# Patient Record
Sex: Male | Born: 1990 | Race: White | Hispanic: No | Marital: Single | Smoking: Never smoker
Health system: Southern US, Community
[De-identification: ages and names within clinical notes are randomized; demographics above are authoritative.]

## PROBLEM LIST (undated history)

## (undated) DIAGNOSIS — G919 Hydrocephalus, unspecified: Secondary | ICD-10-CM

## (undated) DIAGNOSIS — G809 Cerebral palsy, unspecified: Secondary | ICD-10-CM

## (undated) DIAGNOSIS — R569 Unspecified convulsions: Secondary | ICD-10-CM

## (undated) DIAGNOSIS — N200 Calculus of kidney: Secondary | ICD-10-CM

## (undated) HISTORY — PX: ANKLE ARTHROSCOPY: SUR85

---

## 2015-04-23 ENCOUNTER — Encounter (HOSPITAL_COMMUNITY): Payer: Self-pay | Admitting: Emergency Medicine

## 2015-04-23 ENCOUNTER — Emergency Department (HOSPITAL_COMMUNITY)
Admission: EM | Admit: 2015-04-23 | Discharge: 2015-04-24 | Disposition: A | Discharge status: Home / Self Care | Payer: Medicare Other | Attending: Emergency Medicine | Admitting: Emergency Medicine

## 2015-04-23 DIAGNOSIS — N201 Calculus of ureter: Secondary | ICD-10-CM | POA: Diagnosis not present

## 2015-04-23 DIAGNOSIS — R109 Unspecified abdominal pain: Secondary | ICD-10-CM | POA: Diagnosis present

## 2015-04-23 HISTORY — DX: Unspecified convulsions: R56.9

## 2015-04-23 LAB — URINALYSIS, ROUTINE W REFLEX MICROSCOPIC
GLUCOSE, UA: NEGATIVE mg/dL
KETONES UR: 15 mg/dL — AB
NITRITE: NEGATIVE
PH: 5.5 (ref 5.0–8.0)
PROTEIN: 30 mg/dL — AB
Specific Gravity, Urine: 1.026 (ref 1.005–1.030)
UROBILINOGEN UA: 1 mg/dL (ref 0.0–1.0)

## 2015-04-23 LAB — COMPREHENSIVE METABOLIC PANEL
ALK PHOS: 110 U/L (ref 38–126)
ALT: 30 U/L (ref 17–63)
ANION GAP: 10 (ref 5–15)
AST: 26 U/L (ref 15–41)
Albumin: 4 g/dL (ref 3.5–5.0)
BILIRUBIN TOTAL: 0.7 mg/dL (ref 0.3–1.2)
BUN: 10 mg/dL (ref 6–20)
CALCIUM: 9.5 mg/dL (ref 8.9–10.3)
CO2: 25 mmol/L (ref 22–32)
CREATININE: 1.01 mg/dL (ref 0.61–1.24)
Chloride: 104 mmol/L (ref 101–111)
GFR calc Af Amer: >60 mL/min (ref >60)
Glucose, Bld: 96 mg/dL (ref 65–99)
Potassium: 3.7 mmol/L (ref 3.5–5.1)
Sodium: 139 mmol/L (ref 135–145)
Total Protein: 7.5 g/dL (ref 6.5–8.1)

## 2015-04-23 LAB — CBC WITH DIFFERENTIAL/PLATELET
BASOS ABS: 0 10*3/uL (ref 0.0–0.1)
BASOS PCT: 0 % (ref 0–1)
EOS ABS: 0.2 10*3/uL (ref 0.0–0.7)
Eosinophils Relative: 1 % (ref 0–5)
HEMATOCRIT: 42.8 % (ref 39.0–52.0)
Hemoglobin: 14.1 g/dL (ref 13.0–17.0)
LYMPHS PCT: 15 % (ref 12–46)
Lymphs Abs: 2.2 10*3/uL (ref 0.7–4.0)
MCH: 26.8 pg (ref 26.0–34.0)
MCHC: 32.9 g/dL (ref 30.0–36.0)
MCV: 81.4 fL (ref 78.0–100.0)
MONOS PCT: 6 % (ref 3–12)
Monocytes Absolute: 0.8 10*3/uL (ref 0.1–1.0)
NEUTROS ABS: 11.3 10*3/uL — AB (ref 1.7–7.7)
Neutrophils Relative %: 78 % — ABNORMAL HIGH (ref 43–77)
Platelets: 215 10*3/uL (ref 150–400)
RBC: 5.26 MIL/uL (ref 4.22–5.81)
RDW: 13.3 % (ref 11.5–15.5)
WBC: 14.5 10*3/uL — ABNORMAL HIGH (ref 4.0–10.5)

## 2015-04-23 LAB — URINE MICROSCOPIC-ADD ON

## 2015-04-23 LAB — LIPASE, BLOOD: Lipase: 23 U/L (ref 22–51)

## 2015-04-23 MED ORDER — IOHEXOL 300 MG/ML  SOLN
25.0000 mL | Freq: Once | INTRAMUSCULAR | Status: AC | PRN
  Administered 2015-04-23: 25 mL via ORAL

## 2015-04-23 MED ORDER — FENTANYL CITRATE (PF) 100 MCG/2ML IJ SOLN
100.0000 ug | Freq: Once | INTRAMUSCULAR | Status: AC
  Administered 2015-04-23: 100 ug via INTRAVENOUS
  Filled 2015-04-23: qty 2

## 2015-04-23 MED ORDER — ONDANSETRON HCL 4 MG/2ML IJ SOLN
4.0000 mg | Freq: Once | INTRAMUSCULAR | Status: AC
  Administered 2015-04-23: 4 mg via INTRAVENOUS
  Filled 2015-04-23: qty 2

## 2015-04-23 NOTE — ED Notes (Signed)
Pt. reports RLQ pain with emesis onset this evening , denies diarrhea , no fever or chills.

## 2015-04-23 NOTE — ED Provider Notes (Signed)
CSN: [id number]     Arrival date & time 04/23/15  2201 History   First MD Initiated Contact with Patient 04/23/15 2219     Chief Complaint  Patient presents with  . Abdominal Pain     (Consider location/radiation/quality/duration/timing/severity/associated sxs/prior Treatment) Patient is a 24 y.o. male presenting with abdominal pain. The history is provided by the patient.  Abdominal Pain Associated symptoms: nausea and vomiting   Associated symptoms: no chest pain, no diarrhea and no shortness of breath    patient presents with abdominal pain. Began acutely while barbecuing today. Is in the right flank. Has a history of kidney stones but states this does not feel quite like that. It was severe enough that it didn't double him over. He has had vomiting. No diarrhea. No dysuria. No Hematuria. Still aches some. States he last passed kidney stone around a month ago. Also reportedly has been vomiting after eating every meal for around a year. States it is just a little bit of vomiting. Also has episodes of reflux. Poorly had reflux acute also. No fevers. No chills. The pain is dull and constant but does get more severe.  Past Medical History  Diagnosis Date  . Seizures    History reviewed. No pertinent past surgical history. No family history on file. History  Substance Use Topics  . Smoking status: Never Smoker   . Smokeless tobacco: Not on file  . Alcohol Use: No    Review of Systems  Constitutional: Negative for activity change and appetite change.  Eyes: Negative for pain.  Respiratory: Negative for chest tightness and shortness of breath.   Cardiovascular: Negative for chest pain and leg swelling.  Gastrointestinal: Positive for nausea, vomiting and abdominal pain. Negative for diarrhea.  Genitourinary: Positive for flank pain.  Musculoskeletal: Negative for back pain and neck stiffness.  Skin: Negative for rash.  Neurological: Negative for weakness, numbness and headaches.   Psychiatric/Behavioral: Negative for behavioral problems.      Allergies  Review of patient's allergies indicates no known allergies.  Home Medications   Prior to Admission medications   Not on File   BP 134/60 mmHg  Pulse 76  Temp(Src) 97.8 F (36.6 C) (Oral)  Resp 14  Ht 6\' 4"  (1.93 m)  Wt 326 lb (147.873 kg)  BMI 39.70 kg/m2  SpO2 96% Physical Exam  Constitutional: He is oriented to person, place, and time. He appears well-developed and well-nourished.  HENT:  Head: Normocephalic and atraumatic.  Eyes: EOM are normal. Pupils are equal, round, and reactive to light.  Neck: Normal range of motion. Neck supple.  Cardiovascular: Normal rate, regular rhythm and normal heart sounds.   No murmur heard. Pulmonary/Chest: Effort normal and breath sounds normal.  Abdominal: Soft. Bowel sounds are normal. He exhibits no distension and no mass. There is tenderness. There is no rebound and no guarding.  Right-sided abdominal tenderness and right flank. no hernias. No CVA tenderness.  Musculoskeletal: Normal range of motion. He exhibits no edema.  Neurological: He is alert and oriented to person, place, and time. No cranial nerve deficit.  Skin: Skin is warm and dry.  Psychiatric: He has a normal mood and affect.  Nursing note and vitals reviewed.   ED Course  Procedures (including critical care time) Labs Review Labs Reviewed  CBC WITH DIFFERENTIAL/PLATELET - Abnormal; Notable for the following:    WBC 14.5 (*)    Neutrophils Relative % 78 (*)    Neutro Abs 11.3 (*)    All  other components within normal limits  URINALYSIS, ROUTINE W REFLEX MICROSCOPIC (NOT AT [facility]) - Abnormal; Notable for the following:    APPearance CLOUDY (*)    Hgb urine dipstick LARGE (*)    Bilirubin Urine SMALL (*)    Ketones, ur 15 (*)    Protein, ur 30 (*)    Leukocytes, UA TRACE (*)    All other components within normal limits  COMPREHENSIVE METABOLIC PANEL  LIPASE, BLOOD  URINE  MICROSCOPIC-ADD ON    Imaging Review No results found.   EKG Interpretation None      MDM   Final diagnoses:  None    Patient with flank pain. History of ureteral stones. White count is elevated. Also has been vomiting after eating a time. Will get contrast CT due to the vomiting.    Benjiman Core, MD 04/24/15 [phone]

## 2015-04-24 ENCOUNTER — Encounter (HOSPITAL_COMMUNITY): Payer: Self-pay | Admitting: Radiology

## 2015-04-24 ENCOUNTER — Emergency Department (HOSPITAL_COMMUNITY): Payer: Medicare Other

## 2015-04-24 MED ORDER — KETOROLAC TROMETHAMINE 30 MG/ML IJ SOLN
30.0000 mg | Freq: Once | INTRAMUSCULAR | Status: AC
  Administered 2015-04-24: 30 mg via INTRAVENOUS
  Filled 2015-04-24: qty 1

## 2015-04-24 MED ORDER — IOHEXOL 300 MG/ML  SOLN
100.0000 mL | Freq: Once | INTRAMUSCULAR | Status: AC | PRN
  Administered 2015-04-24: 100 mL via INTRAVENOUS

## 2015-04-24 MED ORDER — ONDANSETRON 8 MG PO TBDP: 8.0000 mg | ORAL_TABLET | Freq: Three times a day (TID) | ORAL | Status: DC | PRN

## 2015-04-24 MED ORDER — OXYCODONE-ACETAMINOPHEN 5-325 MG PO TABS: 1.0000 | ORAL_TABLET | Freq: Four times a day (QID) | ORAL | Status: DC | PRN

## 2015-04-24 NOTE — Discharge Instructions (Signed)

## 2016-05-24 ENCOUNTER — Encounter (HOSPITAL_COMMUNITY): Payer: Self-pay | Admitting: *Deleted

## 2016-05-24 ENCOUNTER — Emergency Department (HOSPITAL_COMMUNITY): Payer: Medicare Other

## 2016-05-24 ENCOUNTER — Emergency Department (HOSPITAL_COMMUNITY)
Admission: EM | Admit: 2016-05-24 | Discharge: 2016-05-24 | Disposition: A | Discharge status: Home / Self Care | Payer: Medicare Other | Attending: Emergency Medicine | Admitting: Emergency Medicine

## 2016-05-24 DIAGNOSIS — Z79899 Other long term (current) drug therapy: Secondary | ICD-10-CM | POA: Diagnosis not present

## 2016-05-24 DIAGNOSIS — R109 Unspecified abdominal pain: Secondary | ICD-10-CM

## 2016-05-24 DIAGNOSIS — R1032 Left lower quadrant pain: Secondary | ICD-10-CM | POA: Diagnosis present

## 2016-05-24 HISTORY — DX: Calculus of kidney: N20.0

## 2016-05-24 HISTORY — DX: Cerebral palsy, unspecified: G80.9

## 2016-05-24 LAB — URINALYSIS, ROUTINE W REFLEX MICROSCOPIC
Glucose, UA: NEGATIVE mg/dL
HGB URINE DIPSTICK: NEGATIVE
KETONES UR: 15 mg/dL — AB
Leukocytes, UA: NEGATIVE
Nitrite: NEGATIVE
PROTEIN: NEGATIVE mg/dL
Specific Gravity, Urine: 1.033 — ABNORMAL HIGH (ref 1.005–1.030)
pH: 7 (ref 5.0–8.0)

## 2016-05-24 LAB — LIPASE, BLOOD: Lipase: 25 U/L (ref 11–51)

## 2016-05-24 LAB — CBC
HCT: 44 % (ref 39.0–52.0)
Hemoglobin: 14.2 g/dL (ref 13.0–17.0)
MCH: 26.4 pg (ref 26.0–34.0)
MCHC: 32.3 g/dL (ref 30.0–36.0)
MCV: 81.9 fL (ref 78.0–100.0)
PLATELETS: 221 10*3/uL (ref 150–400)
RBC: 5.37 MIL/uL (ref 4.22–5.81)
RDW: 13.5 % (ref 11.5–15.5)
WBC: 7.8 10*3/uL (ref 4.0–10.5)

## 2016-05-24 LAB — COMPREHENSIVE METABOLIC PANEL
ALK PHOS: 116 U/L (ref 38–126)
ALT: 35 U/L (ref 17–63)
AST: 29 U/L (ref 15–41)
Albumin: 4.2 g/dL (ref 3.5–5.0)
Anion gap: 6 (ref 5–15)
BUN: 11 mg/dL (ref 6–20)
CHLORIDE: 107 mmol/L (ref 101–111)
CO2: 25 mmol/L (ref 22–32)
CREATININE: 0.92 mg/dL (ref 0.61–1.24)
Calcium: 9.6 mg/dL (ref 8.9–10.3)
GFR calc Af Amer: >60 mL/min (ref >60)
GFR calc non Af Amer: >60 mL/min (ref >60)
Glucose, Bld: 100 mg/dL — ABNORMAL HIGH (ref 65–99)
Potassium: 4.2 mmol/L (ref 3.5–5.1)
SODIUM: 138 mmol/L (ref 135–145)
Total Bilirubin: 0.3 mg/dL (ref 0.3–1.2)
Total Protein: 7 g/dL (ref 6.5–8.1)

## 2016-05-24 MED ORDER — OXYCODONE-ACETAMINOPHEN 5-325 MG PO TABS
ORAL_TABLET | ORAL | Status: AC
  Filled 2016-05-24: qty 1

## 2016-05-24 MED ORDER — SODIUM CHLORIDE 0.9 % IV BOLUS (SEPSIS)
1000.0000 mL | Freq: Once | INTRAVENOUS | Status: AC
  Administered 2016-05-24: 1000 mL via INTRAVENOUS

## 2016-05-24 MED ORDER — IOHEXOL 300 MG/ML  SOLN
100.0000 mL | Freq: Once | INTRAMUSCULAR | Status: AC | PRN
  Administered 2016-05-24: 100 mL via INTRAVENOUS

## 2016-05-24 MED ORDER — OXYCODONE-ACETAMINOPHEN 5-325 MG PO TABS
1.0000 | ORAL_TABLET | ORAL | Status: AC | PRN
  Administered 2016-05-24 (×2): 1 via ORAL
  Filled 2016-05-24: qty 1

## 2016-05-24 MED ORDER — KETOROLAC TROMETHAMINE 30 MG/ML IJ SOLN
30.0000 mg | Freq: Once | INTRAMUSCULAR | Status: AC
  Administered 2016-05-24: 30 mg via INTRAVENOUS
  Filled 2016-05-24: qty 1

## 2016-05-24 NOTE — ED Notes (Addendum)
Pt reports onset of LLQ pain last night and mild difficulty urinating. Pt states this feels like a kidney stone, hx of same.  Denies fever.

## 2016-05-24 NOTE — ED Provider Notes (Signed)
Care assumed from previous provider PA Nadeau. Please see note for further details. Briefly, patient here for left-sided abdominal pain radiating to his left flank since last night. History of kidney stones and this feels the same. No urinary symptoms. CT abdomen pending at shift change. Case discussed with PA Jim DesanctisNadeau, plan agreed upon. Will follow-up on CT abdomen results and dispo appropriately. At this time, pain is well controlled and he has not had any nausea or vomiting during ED stay.  Gen: afebrile, VSS, NAD HEENT: EOMI, MMM Resp: no resp distress CV: RRR, no MRG Abd: soft, nonsurgical abdomen. Mild LLQ tenderness. MsK: moving all extremities well Neuro: A&O x4  All labs reviewed. CT abdomen with no acute findings.  Discussed imaging with patient. PCP follow-up recommended, symptomatic home care instructions discussed. Reasons to return to ED were also discussed and all questions answered.  [facility] Center[facility]ime Pilcher Ward, PA-C 05/24/16 1723  Blane OharaJoshua Zavitz, MD 05/25/16 [phone]

## 2016-05-24 NOTE — ED Notes (Signed)
Patient Alert and oriented X4. Stable and ambulatory. Patient verbalized understanding of the discharge instructions.  Patient belongings were taken by the patient.  

## 2016-05-24 NOTE — ED Provider Notes (Signed)
CSN: [id number]     Arrival date & time 05/24/16  1101 History   First MD Initiated Contact with Patient 05/24/16 1205     Chief Complaint  Patient presents with  . Abdominal Pain  . Flank Pain     (Consider location/radiation/quality/duration/timing/severity/associated sxs/prior Treatment) HPI   Patient is a 25 year old male with past medical history of kidney stones, CP, epilepsy who presents the ED with complaints of abdominal pain, onset last night. Patient reports having constant dull aching pain to his left lower quadrant which radiates around to his left flank with intermittent episodes of sharp stabbing pain. Patient reports pain is worse with movement. Patient reports his pain feels similar to when he had a kidney stone in the past. Denies fever, chills, shortness of breath, chest pain, nausea, vomiting, diarrhea, dysuria, hematuria, urinary symptoms. Patient reports taking Motrin at home last night without relief. Denies any history of abdominal surgeries.  Past Medical History  Diagnosis Date  . Seizures (HCC)   . Kidney stone   . CP (cerebral palsy) (HCC)    History reviewed. No pertinent past surgical history. History reviewed. No pertinent family history. Social History  Substance Use Topics  . Smoking status: Never Smoker   . Smokeless tobacco: None  . Alcohol Use: No    Review of Systems  Gastrointestinal: Positive for abdominal pain.  Genitourinary: Positive for flank pain.  All other systems reviewed and are negative.     Allergies  Review of patient's allergies indicates no known allergies.  Home Medications   Prior to Admission medications   Medication Sig Start Date End Date Taking? Authorizing Provider  levETIRAcetam (KEPPRA) 500 MG tablet Take 1,000 mg by mouth 2 (two) times daily.    Historical Provider, MD  ondansetron (ZOFRAN-ODT) 8 MG disintegrating tablet Take 1 tablet (8 mg total) by mouth every 8 (eight) hours as needed for nausea or  vomiting. 04/24/15   Benjiman CoreNathan Pickering, MD  oxyCODONE-acetaminophen (PERCOCET/ROXICET) 5-325 MG per tablet Take 1-2 tablets by mouth every 6 (six) hours as needed for severe pain. 04/24/15   Benjiman CoreNathan Pickering, MD   BP 111/67 mmHg  Pulse 73  Temp(Src) 98.1 F (36.7 C) (Oral)  Resp 16  SpO2 97% Physical Exam  Constitutional: He is oriented to person, place, and time. He appears well-developed and well-nourished. No distress.  HENT:  Head: Normocephalic and atraumatic.  Mouth/Throat: Oropharynx is clear and moist. No oropharyngeal exudate.  Eyes: Conjunctivae and EOM are normal. Right eye exhibits no discharge. Left eye exhibits no discharge. No scleral icterus.  Neck: Normal range of motion. Neck supple.  Cardiovascular: Normal rate, regular rhythm, normal heart sounds and intact distal pulses.   Pulmonary/Chest: Effort normal and breath sounds normal. No respiratory distress. He has no wheezes. He has no rales. He exhibits no tenderness.  Abdominal: Soft. Bowel sounds are normal. He exhibits no distension and no mass. There is tenderness (LLQ). There is no rebound and no guarding.  Left CVA tenderness  Musculoskeletal: Normal range of motion. He exhibits no edema.  Neurological: He is alert and oriented to person, place, and time.  Skin: Skin is warm and dry. He is not diaphoretic.  Nursing note and vitals reviewed.   ED Course  Procedures (including critical care time) Labs Review Labs Reviewed  COMPREHENSIVE METABOLIC PANEL - Abnormal; Notable for the following:    Glucose, Bld 100 (*)    All other components within normal limits  LIPASE, BLOOD  CBC  URINALYSIS, ROUTINE W  REFLEX MICROSCOPIC (NOT AT [facility])    Imaging Review No results found. I have personally reviewed and evaluated these images and lab results as part of my medical decision-making.   EKG Interpretation None      MDM   Final diagnoses:  None   Patient presents with left-sided abdominal pain radiating to  his left flank since last night. Pain is worse with movement. Patient reports pain feels similar to when he has had kidney stones in the past. Denies fever or urinary symptoms. VSS. Exam revealed tenderness to left lower quadrant, no peritoneal signs, left CVA tenderness. Remaining exam unremarkable. Patient given IV fluids and pain meds in the ED. Labs and urine unremarkable. On reevaluation patient continues to have tenderness and left lower quadrant. Order CT abdomen for further evaluation.  Hand-off to [facility], PA-C. Pending CT abdomen. Dispo pending imaging results.     Satira Sark [place], [place] 05/24/16 1554   Loren Racer, MD 06/09/16 [phone]

## 2016-05-24 NOTE — ED Notes (Signed)
Attempted to obtain urine specimen; Pt unable to provide one at this time 

## 2016-05-24 NOTE — Discharge Instructions (Signed)
Alternate between ibuprofen and tylenol as needed for pain.  Follow up with your primary care physician for discussion of today's visit.   Please seek immediate care if you develop any of the following symptoms: The pain does not go away.  You have a fever.  You keep throwing up (vomiting). You pass bloody or black tarry stools.  There is bright red blood in the stool.  Constipation stays for more than 4 days. There is rectal pain.  You do not seem to be getting better.  You have any questions or concerns.

## 2017-02-24 ENCOUNTER — Encounter (HOSPITAL_COMMUNITY): Payer: Self-pay

## 2017-02-24 ENCOUNTER — Emergency Department (HOSPITAL_COMMUNITY): Payer: Medicare Other

## 2017-02-24 ENCOUNTER — Emergency Department (HOSPITAL_COMMUNITY)
Admission: EM | Admit: 2017-02-24 | Discharge: 2017-02-24 | Disposition: A | Discharge status: Home / Self Care | Payer: Medicare Other | Attending: Emergency Medicine | Admitting: Emergency Medicine

## 2017-02-24 DIAGNOSIS — Y929 Unspecified place or not applicable: Secondary | ICD-10-CM | POA: Diagnosis not present

## 2017-02-24 DIAGNOSIS — R569 Unspecified convulsions: Secondary | ICD-10-CM | POA: Diagnosis not present

## 2017-02-24 DIAGNOSIS — Y939 Activity, unspecified: Secondary | ICD-10-CM | POA: Diagnosis not present

## 2017-02-24 DIAGNOSIS — Z23 Encounter for immunization: Secondary | ICD-10-CM | POA: Diagnosis not present

## 2017-02-24 DIAGNOSIS — Y999 Unspecified external cause status: Secondary | ICD-10-CM | POA: Insufficient documentation

## 2017-02-24 DIAGNOSIS — S01111A Laceration without foreign body of right eyelid and periocular area, initial encounter: Secondary | ICD-10-CM | POA: Diagnosis not present

## 2017-02-24 DIAGNOSIS — S0083XA Contusion of other part of head, initial encounter: Secondary | ICD-10-CM

## 2017-02-24 DIAGNOSIS — W2203XA Walked into furniture, initial encounter: Secondary | ICD-10-CM | POA: Diagnosis not present

## 2017-02-24 DIAGNOSIS — Z79899 Other long term (current) drug therapy: Secondary | ICD-10-CM | POA: Insufficient documentation

## 2017-02-24 DIAGNOSIS — S0591XA Unspecified injury of right eye and orbit, initial encounter: Secondary | ICD-10-CM | POA: Diagnosis present

## 2017-02-24 LAB — MAGNESIUM: Magnesium: 2 mg/dL (ref 1.7–2.4)

## 2017-02-24 LAB — CBC
HCT: 40.7 % (ref 39.0–52.0)
Hemoglobin: 13 g/dL (ref 13.0–17.0)
MCH: 25.8 pg — AB (ref 26.0–34.0)
MCHC: 31.9 g/dL (ref 30.0–36.0)
MCV: 80.8 fL (ref 78.0–100.0)
PLATELETS: 177 10*3/uL (ref 150–400)
RBC: 5.04 MIL/uL (ref 4.22–5.81)
RDW: 14 % (ref 11.5–15.5)
WBC: 9.9 10*3/uL (ref 4.0–10.5)

## 2017-02-24 LAB — URINALYSIS, ROUTINE W REFLEX MICROSCOPIC
BACTERIA UA: NONE SEEN
Bilirubin Urine: NEGATIVE
Glucose, UA: NEGATIVE mg/dL
KETONES UR: NEGATIVE mg/dL
Leukocytes, UA: NEGATIVE
Nitrite: NEGATIVE
PH: 5 (ref 5.0–8.0)
Protein, ur: NEGATIVE mg/dL
SPECIFIC GRAVITY, URINE: 1.018 (ref 1.005–1.030)
SQUAMOUS EPITHELIAL / LPF: NONE SEEN

## 2017-02-24 LAB — BASIC METABOLIC PANEL
Anion gap: 8 (ref 5–15)
BUN: 11 mg/dL (ref 6–20)
CO2: 21 mmol/L — ABNORMAL LOW (ref 22–32)
CREATININE: 0.93 mg/dL (ref 0.61–1.24)
Calcium: 8.9 mg/dL (ref 8.9–10.3)
Chloride: 106 mmol/L (ref 101–111)
GFR calc Af Amer: >60 mL/min (ref >60)
Glucose, Bld: 101 mg/dL — ABNORMAL HIGH (ref 65–99)
Potassium: 3.9 mmol/L (ref 3.5–5.1)
SODIUM: 135 mmol/L (ref 135–145)

## 2017-02-24 MED ORDER — SODIUM CHLORIDE 0.9 % IV SOLN
1000.0000 mg | Freq: Once | INTRAVENOUS | Status: DC
  Filled 2017-02-24: qty 10

## 2017-02-24 MED ORDER — TETANUS-DIPHTH-ACELL PERTUSSIS 5-2.5-18.5 LF-MCG/0.5 IM SUSP
0.5000 mL | Freq: Once | INTRAMUSCULAR | Status: AC
  Administered 2017-02-24: 0.5 mL via INTRAMUSCULAR
  Filled 2017-02-24: qty 0.5

## 2017-02-24 MED ORDER — LEVETIRACETAM 500 MG PO TABS: 1000.0000 mg | ORAL_TABLET | Freq: Two times a day (BID) | ORAL | 0 refills | Status: DC

## 2017-02-24 MED ORDER — SODIUM CHLORIDE 0.9 % IV BOLUS (SEPSIS)
1000.0000 mL | Freq: Once | INTRAVENOUS | Status: AC
  Administered 2017-02-24: 1000 mL via INTRAVENOUS

## 2017-02-24 MED ORDER — METOCLOPRAMIDE HCL 5 MG/ML IJ SOLN
10.0000 mg | Freq: Once | INTRAMUSCULAR | Status: AC
  Administered 2017-02-24: 10 mg via INTRAVENOUS
  Filled 2017-02-24: qty 2

## 2017-02-24 MED ORDER — DIPHENHYDRAMINE HCL 50 MG/ML IJ SOLN
25.0000 mg | Freq: Once | INTRAMUSCULAR | Status: AC
  Administered 2017-02-24: 25 mg via INTRAVENOUS
  Filled 2017-02-24: qty 1

## 2017-02-24 MED ORDER — ACETAMINOPHEN 325 MG PO TABS
650.0000 mg | ORAL_TABLET | Freq: Once | ORAL | Status: AC
  Administered 2017-02-24: 650 mg via ORAL
  Filled 2017-02-24: qty 2

## 2017-02-24 NOTE — ED Provider Notes (Addendum)
[facility]-EMERGENCY DEPT Provider Note   CSN: [id number] Arrival date & time: 02/24/17  4098     History   Chief Complaint Chief Complaint  Patient presents with  . Fall    HPI Micheal Jensen is a 26 y.o. male.  HPI   26 yo M with PMHx of CP here with seizure. Pt was in his usual state of health until around 4:30 this morning. [place] walking. He drank plenty of water, though, and was normal upon going ot bed last night. This morning, he awoke around 4:45 AM with a headache and bruising to his right eye, as well as as sore tongue. He saw signs that he had hit his head on a nearby dresser. He suspects he had a seizure and he has a h/o similar episodes. He was on keppra 1 g BID x 3 years, since his last seizure, but recently stopped his keppra as he had not had a seizure in so long.  Currently he feels tired and endorses a mild, aching, right sided HA. No vision changes. No recent fevers. No abdominal pain, nausea, or vomiting. He did bite his tongue but denies any loose or sore teeth. Unknown when his last tetanus shot was. No blood thinner use. No other recent medication changes. nO etoh use.  Past Medical History:  Diagnosis Date  . CP (cerebral palsy) (HCC)   . Kidney stone   . Seizures (HCC)     There are no active problems to display for this patient.   History reviewed. No pertinent surgical history.     Home Medications    Prior to Admission medications   Medication Sig Start Date End Date Taking? Authorizing Provider  levETIRAcetam (KEPPRA) 500 MG tablet Take 2 tablets (1,000 mg total) by mouth 2 (two) times daily. 02/24/17 03/26/17  Shaune Pollack, MD  ondansetron (ZOFRAN-ODT) 8 MG disintegrating tablet Take 1 tablet (8 mg total) by mouth every 8 (eight) hours as needed for nausea or vomiting. Patient not taking: Reported on 02/24/2017 04/24/15   Benjiman Core, MD  oxyCODONE-acetaminophen (PERCOCET/ROXICET) 5-325 MG per  tablet Take 1-2 tablets by mouth every 6 (six) hours as needed for severe pain. Patient not taking: Reported on 02/24/2017 04/24/15   Benjiman Core, MD    Family History No family history on file.  Social History Social History  Substance Use Topics  . Smoking status: Never Smoker  . Smokeless tobacco: Never Used  . Alcohol use No     Allergies   Patient has no known allergies.   Review of Systems Review of Systems  Constitutional: Positive for fatigue. Negative for chills and fever.  HENT: Positive for facial swelling. Negative for congestion and rhinorrhea.   Eyes: Negative for visual disturbance.  Respiratory: Negative for cough, shortness of breath and wheezing.   Cardiovascular: Negative for chest pain and leg swelling.  Gastrointestinal: Negative for abdominal pain, diarrhea, nausea and vomiting.  Genitourinary: Negative for dysuria and flank pain.  Musculoskeletal: Negative for neck pain and neck stiffness.  Skin: Positive for wound. Negative for rash.  Allergic/Immunologic: Negative for immunocompromised state.  Neurological: Positive for seizures. Negative for syncope, weakness and headaches.  All other systems reviewed and are negative.    Physical Exam Updated Vital Signs BP 111/90   Pulse 72   Temp 98.4 F (36.9 C)   Resp 19   Ht  (1.93 m)   Wt (!) 330 lb (149.7  kg)   SpO2 95%   BMI 40.17 kg/m   Physical Exam  Constitutional: He is oriented to person, place, and time. He appears well-developed and well-nourished. No distress.  HENT:  Head: Normocephalic and atraumatic.  Mild bruising to right lateral face/maxillary cheek. Superficial, approx 1 cm laceration to right brow. No involvement of the lid. EOM full and painless. No facial deformity or step offs. Mild contusion to right lateral aspect of tongue. No lacerations.  Eyes: Conjunctivae are normal.  Neck: Neck supple.  No midline TTP. Full, painless pROM.  Cardiovascular: Normal rate,  regular rhythm and normal heart sounds.  Exam reveals no friction rub.   No murmur heard. Pulmonary/Chest: Effort normal and breath sounds normal. No respiratory distress. He has no wheezes. He has no rales.  Abdominal: He exhibits no distension.  Musculoskeletal: He exhibits no edema.  Neurological: He is alert and oriented to person, place, and time. He has normal strength. No cranial nerve deficit or sensory deficit. He exhibits normal muscle tone. GCS eye subscore is 4. GCS verbal subscore is 5. GCS motor subscore is 6.  Skin: Skin is warm. Capillary refill takes less than 2 seconds.  Psychiatric: He has a normal mood and affect.  Nursing note and vitals reviewed.    ED Treatments / Results  Labs (all labs ordered are listed, but only abnormal results are displayed) Labs Reviewed  CBC - Abnormal; Notable for the following:       Result Value   MCH 25.8 (*)    All other components within normal limits  BASIC METABOLIC PANEL - Abnormal; Notable for the following:    CO2 21 (*)    Glucose, Bld 101 (*)    All other components within normal limits  URINALYSIS, ROUTINE W REFLEX MICROSCOPIC - Abnormal; Notable for the following:    Hgb urine dipstick SMALL (*)    All other components within normal limits  MAGNESIUM    EKG  EKG Interpretation  Date/Time:  Monday February 24 2017 08:27:34 EDT Ventricular Rate:  94 PR Interval:    QRS Duration: 120 QT Interval:  374 QTC Calculation: 468 R Axis:   -13 Text Interpretation:  Sinus rhythm Nonspecific intraventricular conduction delay No old tracing to compare Confirmed by Julis Haubner MD, Donivin Wirt [phone]) on 02/24/2017 8:34:59 AM       Radiology Ct Head Wo Contrast  Result Date: 02/24/2017 CLINICAL DATA:  Pt is coming from home with concerns of having a seizure this morning at approximately 0400. Pt reports waking up in his bed with right eye laceration and concern of a seizure. EXAM: CT HEAD WITHOUT CONTRAST TECHNIQUE: Contiguous axial  images were obtained from the base of the skull through the vertex without intravenous contrast. COMPARISON:  08/22/2011 CT brain, 03/23/2010 MR brain FINDINGS: Brain: No evidence of acute infarction, hemorrhage, hydrocephalus, extra-axial collection or mass lesion/mass effect. 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 Vascular: No hyperdense vessel or unexpected calcification. Skull: No osseous abnormality. Sinuses/Orbits: Visualized paranasal sinuses are clear. Visualized mastoid sinuses are clear. Visualized orbits demonstrate no focal abnormality. Other: None IMPRESSION: 1. No acute intracranial pathology. Electronically Signed   By: Elige Ko   On: 02/24/2017 10:24    Procedures .Marland KitchenLaceration Repair Date/Time: 02/24/2017 9:26 AM Performed by: Shaune Pollack Authorized by: Shaune Pollack   Consent:    Consent obtained:  Verbal   Consent given by:  Patient   Risks discussed:  Infection, need for additional repair, nerve damage,  poor cosmetic result, pain, poor wound healing, retained foreign body, tendon damage and vascular damage   Alternatives discussed:  Observation and referral Laceration details:    Location:  Face   Face location:  R eyebrow   Length (cm):  1 Repair type:    Repair type:  Simple Pre-procedure details:    Preparation:  Patient was prepped and draped in usual sterile fashion and imaging obtained to evaluate for foreign bodies Exploration:    Hemostasis achieved with:  Direct pressure   Wound exploration: wound explored through full range of motion and entire depth of wound probed and visualized     Wound extent: no muscle damage noted, no nerve damage noted and no tendon damage noted     Contaminated: no   Treatment:    Area cleansed with:  Hibiclens   Amount of cleaning:  Standard   Irrigation solution:  Sterile saline   Irrigation method:  Pressure wash Skin repair:    Repair method:  Tissue  adhesive Approximation:    Approximation:  Close   Vermilion border: well-aligned   Post-procedure details:    Dressing:  Open (no dressing)   Patient tolerance of procedure:  Tolerated well, no immediate complications   (including critical care time)  Medications Ordered in ED Medications  levETIRAcetam (KEPPRA) 1,000 mg in sodium chloride 0.9 % 100 mL IVPB (0 mg Intravenous Hold 02/24/17 0917)  Tdap (BOOSTRIX) injection 0.5 mL (0.5 mLs Intramuscular Given 02/24/17 0841)  sodium chloride 0.9 % bolus 1,000 mL (1,000 mLs Intravenous New Bag/Given 02/24/17 0852)  metoCLOPramide (REGLAN) injection 10 mg (10 mg Intravenous Given 02/24/17 0910)  diphenhydrAMINE (BENADRYL) injection 25 mg (25 mg Intravenous Given 02/24/17 0909)  acetaminophen (TYLENOL) tablet 650 mg (650 mg Oral Given 02/24/17 0913)     Initial Impression / Assessment and Plan / ED Course  I have reviewed the triage vital signs and the nursing notes.  Pertinent labs & imaging results that were available during my care of the patient were reviewed by me and considered in my medical decision making (see chart for details).     26 yo M with h/o CP, seizure d/o here with suspected seizure and subsequent minor head trauma lac. Regarding his seizure, I suspect this is 2/2 recent discontinuation of Keppra. May also be a component of mild dehydration but labs, electrolytes are o/w unremarkable. EKG non-ischemic, no apparent arrhythmia and history is more c/w known seizure disorder. Laceration closed as above and CT head neg for traumatic or other acute abnormality. Discussed with Dr. Amada Jupiter of Neurology. Pt already took 1 g of keppra prior to arrival so will hold on load at this time. Will advise no driving, seizure precautions, and have pt restart on his keppra at a dose of 1000 mg BID. Will have him f/u with his outpt neurologist.  Final Clinical Impressions(s) / ED Diagnoses   Final diagnoses:  Seizure (HCC)  Contusion of face,  initial encounter  Laceration of right eyebrow, initial encounter    New Prescriptions New Prescriptions   LEVETIRACETAM (KEPPRA) 500 MG TABLET    Take 2 tablets (1,000 mg total) by mouth 2 (two) times daily.     Shaune Pollack, MD 02/24/17 1045    Shaune Pollack, MD 02/24/17 1049

## 2017-02-24 NOTE — ED Triage Notes (Signed)
Per Pt - Pt is coming from home with concerns of having a seizure this morning at approximately 0400. Pt reports waking up in his bed with right eye laceration and concern of a seizure. Pt has a hx of the same and recently came off of Keppra due to seizure free for three years. Pt is alert and oriented x4 upon arrival to the ED.

## 2017-02-24 NOTE — Discharge Instructions (Signed)

## 2017-02-24 NOTE — ED Notes (Signed)
MD Isaacs at the bedside  

## 2017-02-24 NOTE — ED Notes (Signed)
Pt taken to CT.

## 2017-03-24 ENCOUNTER — Encounter (HOSPITAL_COMMUNITY): Payer: Self-pay | Admitting: Emergency Medicine

## 2017-03-24 DIAGNOSIS — R569 Unspecified convulsions: Secondary | ICD-10-CM | POA: Insufficient documentation

## 2017-03-24 DIAGNOSIS — Z79899 Other long term (current) drug therapy: Secondary | ICD-10-CM | POA: Diagnosis not present

## 2017-03-24 LAB — CBG MONITORING, ED: Glucose-Capillary: 131 mg/dL — ABNORMAL HIGH (ref 65–99)

## 2017-03-24 NOTE — ED Triage Notes (Addendum)
Per [facility]: Pt to ED from home following witnessed seizure, lasting 4 minutes. Patient was sitting on couch while watching TV - family states he pointed at something, said "what's that?" and his eyes rolled back into his head. Did not fall, he was helped to floor by family. Family also states pt's lips turned blue while shaking. Pt briefly post-ictal with Fire, but upon EMS arrival, was A&O x 4. Patient was taken off seizure medication 3 years ago d/t not having had any for years, and didn't have another one until 2 weeks ago, was seen and treated here then and placed back on Keppra. He [place] the beach over the [place] and forgot to take last night's and this morning's dose, but did take this evening's dose (just before the seizure). Patient bit tongue, but bleeding is controlled. EMS VS: 142/88, HR 116 regular, 95% RA, CBG 98. Patient has no complaints at this time. No incontinence. Ambulatory with steady gait.

## 2017-03-25 ENCOUNTER — Emergency Department (HOSPITAL_COMMUNITY)
Admission: EM | Admit: 2017-03-25 | Discharge: 2017-03-25 | Disposition: A | Discharge status: Home / Self Care | Payer: Medicare Other | Attending: Emergency Medicine | Admitting: Emergency Medicine

## 2017-03-25 DIAGNOSIS — R569 Unspecified convulsions: Secondary | ICD-10-CM | POA: Diagnosis not present

## 2017-03-25 HISTORY — DX: Hydrocephalus, unspecified: G91.9

## 2017-03-25 LAB — BASIC METABOLIC PANEL
Anion gap: 7 (ref 5–15)
BUN: 11 mg/dL (ref 6–20)
CHLORIDE: 107 mmol/L (ref 101–111)
CO2: 21 mmol/L — AB (ref 22–32)
CREATININE: 0.95 mg/dL (ref 0.61–1.24)
Calcium: 8.5 mg/dL — ABNORMAL LOW (ref 8.9–10.3)
GFR calc non Af Amer: >60 mL/min (ref >60)
Glucose, Bld: 122 mg/dL — ABNORMAL HIGH (ref 65–99)
POTASSIUM: 3.6 mmol/L (ref 3.5–5.1)
Sodium: 135 mmol/L (ref 135–145)

## 2017-03-25 MED ORDER — LEVETIRACETAM 500 MG PO TABS: 1500.0000 mg | ORAL_TABLET | Freq: Two times a day (BID) | ORAL | 0 refills | Status: AC

## 2017-03-25 MED ORDER — LEVETIRACETAM 500 MG/5ML IV SOLN
1000.0000 mg | Freq: Once | INTRAVENOUS | Status: AC
  Administered 2017-03-25: 1000 mg via INTRAVENOUS
  Filled 2017-03-25: qty 10

## 2017-03-25 MED ORDER — HYDROCODONE-ACETAMINOPHEN 5-325 MG PO TABS
1.0000 | ORAL_TABLET | Freq: Once | ORAL | Status: AC
  Administered 2017-03-25: 1 via ORAL
  Filled 2017-03-25: qty 1

## 2017-03-25 NOTE — ED Notes (Signed)
Patient Alert and oriented X4. Stable and ambulatory. Patient verbalized understanding of the discharge instructions.  Patient belongings were taken by the patient.  

## 2017-03-25 NOTE — Discharge Instructions (Signed)
You were seen today for seizure activity. Her workup is reassuring. Increase Keppra to 1500 mg twice daily. Follow-up with neurology. Make sure that you take your doses daily as directed.

## 2017-03-25 NOTE — ED Notes (Signed)
ED Physician at bedside  

## 2017-03-25 NOTE — ED Provider Notes (Signed)
[facility] Provider Note   CSN: [id number] Arrival date & time: 03/24/17  2323  By signing my name below, I, Micheal Jensen, attest that this documentation has been prepared under the direction and in the presence of Bently Morath, Mayer Maskerourtney F, MD. Electronically Signed: Rosario AdieWilliam Andrew Jensen, ED Scribe. 03/25/17. 1:21 AM.  History   Chief Complaint Chief Complaint  Patient presents with  . Seizures   The history is provided by the patient. No language interpreter was used.    HPI Comments: Micheal ClinesJesse Jensen is a 26 y.o. male BIB EMS, with a h/o seizures, hydrocephalus, and cerebral palsy, who presents to the Emergency Department s/p seizure-like activity which occurred this evening prior to arrival. Per family, pt was sitting on their couch watching television when he pointed saying, "what's that?" and his eyes rolled back into his head and he began experiencing seizure-like activity. He did not fully fall and was helped to the floor by surrounding family members. His family members note that he became rigid in his extremitities, and his face became pale and his lips turned blue. There was mild jerking. This lasted for approximately three minutes until his symptoms resolved. While in the ED he states that he feels nearly back to his baseline; however, he does have a generalized headache and mild lower back pain. Per family member, pt does have a h/o seizure, but he had not had a seizure until about two weeks ago which was the first in approximately five years. He was placed back on to 1000mg  BID of Keppra following this, however, he did miss a dose last night. Pt was previously followed by a Neurologist, and is currently working to establish care again. He denies alcohol or illicit drug usage. He denies visual disturbance, fever, or any other associated symptoms.   Past Medical History:  Diagnosis Date  . CP (cerebral palsy) (HCC)   . Hydrocephalus   . Kidney stone   . Seizures (HCC)    as a  baby   There are no active problems to display for this patient.  History reviewed. No pertinent surgical history.  Home Medications    Prior to Admission medications   Medication Sig Start Date End Date Taking? Authorizing Provider  levETIRAcetam (KEPPRA) 500 MG tablet Take 3 tablets (1,500 mg total) by mouth 2 (two) times daily. 03/25/17 04/24/17  Recardo Linn, Mayer Maskerourtney F, MD  ondansetron (ZOFRAN-ODT) 8 MG disintegrating tablet Take 1 tablet (8 mg total) by mouth every 8 (eight) hours as needed for nausea or vomiting. Patient not taking: Reported on 02/24/2017 04/24/15   Benjiman CorePickering, Nathan, MD  oxyCODONE-acetaminophen (PERCOCET/ROXICET) 5-325 MG per tablet Take 1-2 tablets by mouth every 6 (six) hours as needed for severe pain. Patient not taking: Reported on 02/24/2017 04/24/15   Benjiman CorePickering, Nathan, MD   Family History No family history on file.  Social History Social History  Substance Use Topics  . Smoking status: Never Smoker  . Smokeless tobacco: Never Used  . Alcohol use No   Allergies   Patient has no known allergies.  Review of Systems Review of Systems  Constitutional: Negative for fever.  Respiratory: Negative for shortness of breath.   Cardiovascular: Negative for chest pain.  Musculoskeletal: Positive for back pain and myalgias.  Neurological: Positive for seizures and headaches.  All other systems reviewed and are negative.  Physical Exam Updated Vital Signs BP 121/87   Pulse 86   Temp 98.8 F (37.1 C) (Oral)   Resp 17   Ht 6'  3" (1.905 m)   Wt (!) 150.6 kg (332 lb)   SpO2 98%   BMI 41.50 kg/m   Physical Exam  Constitutional: He is oriented to person, place, and time. He appears well-developed and well-nourished.  Obese, no acute distress  HENT:  Head: Normocephalic and atraumatic.  Eyes: Pupils are equal, round, and reactive to light.  Cardiovascular: Normal rate, regular rhythm and normal heart sounds.   No murmur heard. Pulmonary/Chest: Effort normal and  breath sounds normal. No respiratory distress. He has no wheezes.  Abdominal: Soft. There is no tenderness.  Musculoskeletal: He exhibits no edema.  Contracture left upper extremity mostly in the hand  Neurological: He is alert and oriented to person, place, and time.  5 out of 5 strength in all 4, no dysmetria to finger-nose-finger, normal gait  Skin: Skin is warm and dry.  Psychiatric: He has a normal mood and affect.  Nursing note and vitals reviewed.  ED Treatments / Results  DIAGNOSTIC STUDIES: Oxygen Saturation is 97% on RA, normal by my interpretation.   COORDINATION OF CARE: 1:21 AM-Discussed next steps with pt. Pt verbalized understanding and is agreeable with the plan.   Labs (all labs ordered are listed, but only abnormal results are displayed) Labs Reviewed  BASIC METABOLIC PANEL - Abnormal; Notable for the following:       Result Value   CO2 21 (*)    Glucose, Bld 122 (*)    Calcium 8.5 (*)    All other components within normal limits  CBG MONITORING, ED - Abnormal; Notable for the following:    Glucose-Capillary 131 (*)    All other components within normal limits   EKG  EKG Interpretation None      Radiology No results found.  Procedures Procedures   Medications Ordered in ED Medications  levETIRAcetam (KEPPRA) 1,000 mg in sodium chloride 0.9 % 100 mL IVPB (0 mg Intravenous Stopped 03/25/17 0157)  HYDROcodone-acetaminophen (NORCO/VICODIN) 5-325 MG per tablet 1 tablet (1 tablet Oral Given 03/25/17 0149)    Initial Impression / Assessment and Plan / ED Course  I have reviewed the triage vital signs and the nursing notes.  Pertinent labs & imaging results that were available during my care of the patient were reviewed by me and considered in my medical decision making (see chart for details).     Patient presents with seizure activity from home. Mr. dose of his Keppra last night. Recently began having seizures again and was started on Keppra. He is  nontoxic-appearing. Neurologically intact. Basic labwork showed no metabolite abnormalities. Patient was loaded with a gram of Keppra. Will increase Keppra to 1500 mg twice a day. He will need to follow-up with neurology.  After history, exam, and medical workup I feel the patient has been appropriately medically screened and is safe for discharge home. Pertinent diagnoses were discussed with the patient. Patient was given return precautions.   Final Clinical Impressions(s) / ED Diagnoses   Final diagnoses:  Seizure [facility])   New Prescriptions Current Discharge Medication List     I personally performed the services described in this documentation, which was scribed in my presence. The recorded information has been reviewed and is accurate.     Shon Baton, MD 03/25/17 [phone]

## 2017-06-14 ENCOUNTER — Emergency Department (HOSPITAL_COMMUNITY): Payer: Medicare Other

## 2017-06-14 ENCOUNTER — Encounter (HOSPITAL_COMMUNITY): Payer: Self-pay

## 2017-06-14 ENCOUNTER — Emergency Department (HOSPITAL_COMMUNITY)
Admission: EM | Admit: 2017-06-14 | Discharge: 2017-06-14 | Disposition: A | Discharge status: Home / Self Care | Payer: Medicare Other | Attending: Emergency Medicine | Admitting: Emergency Medicine

## 2017-06-14 DIAGNOSIS — Y998 Other external cause status: Secondary | ICD-10-CM | POA: Insufficient documentation

## 2017-06-14 DIAGNOSIS — Y9389 Activity, other specified: Secondary | ICD-10-CM | POA: Insufficient documentation

## 2017-06-14 DIAGNOSIS — G809 Cerebral palsy, unspecified: Secondary | ICD-10-CM | POA: Insufficient documentation

## 2017-06-14 DIAGNOSIS — S8992XA Unspecified injury of left lower leg, initial encounter: Secondary | ICD-10-CM | POA: Diagnosis present

## 2017-06-14 DIAGNOSIS — S8012XA Contusion of left lower leg, initial encounter: Secondary | ICD-10-CM | POA: Diagnosis not present

## 2017-06-14 DIAGNOSIS — W01198A Fall on same level from slipping, tripping and stumbling with subsequent striking against other object, initial encounter: Secondary | ICD-10-CM | POA: Diagnosis not present

## 2017-06-14 DIAGNOSIS — Y929 Unspecified place or not applicable: Secondary | ICD-10-CM | POA: Insufficient documentation

## 2017-06-14 DIAGNOSIS — Z79899 Other long term (current) drug therapy: Secondary | ICD-10-CM | POA: Insufficient documentation

## 2017-06-14 MED ORDER — IBUPROFEN 600 MG PO TABS: 600.0000 mg | ORAL_TABLET | Freq: Four times a day (QID) | ORAL | 0 refills | Status: DC | PRN

## 2017-06-14 NOTE — ED Triage Notes (Signed)
Patient complains of left lower leg pain with bruise after tripping and fall last night. Alert and oriented, no deformity. NAD

## 2017-06-14 NOTE — ED Provider Notes (Signed)
[facility] Provider Note   CSN: [id number] Arrival date & time: 06/14/17  1128     History   Chief Complaint No chief complaint on file.   HPI Micheal Jensen is a 26 y.o. male.  The history is provided by the patient. No language interpreter was used.  Leg Pain   The current episode started yesterday. The problem occurs constantly. The problem has not changed since onset.The pain is present in the right lower leg. The quality of the pain is described as aching. The pain is moderate. Associated symptoms include limited range of motion and stiffness. The symptoms are aggravated by cold. He has tried nothing for the symptoms. The treatment provided no relief. There has been no history of extremity trauma. Family history is significant for no rheumatoid arthritis.  Pt reports he fell yesterday while fishing and his his leg on a log   Past Medical History:  Diagnosis Date  . CP (cerebral palsy) (HCC)   . Hydrocephalus   . Kidney stone   . Seizures (HCC)    as a baby    There are no active problems to display for this patient.   History reviewed. No pertinent surgical history.     Home Medications    Prior to Admission medications   Medication Sig Start Date End Date Taking? Authorizing Provider  levETIRAcetam (KEPPRA) 500 MG tablet Take 3 tablets (1,500 mg total) by mouth 2 (two) times daily. 03/25/17 04/24/17  Horton, Mayer Masker, MD  ondansetron (ZOFRAN-ODT) 8 MG disintegrating tablet Take 1 tablet (8 mg total) by mouth every 8 (eight) hours as needed for nausea or vomiting. Patient not taking: Reported on 02/24/2017 04/24/15   Benjiman Core, MD  oxyCODONE-acetaminophen (PERCOCET/ROXICET) 5-325 MG per tablet Take 1-2 tablets by mouth every 6 (six) hours as needed for severe pain. Patient not taking: Reported on 02/24/2017 04/24/15   Benjiman Core, MD    Family History No family history on file.  Social History Social History  Substance Use Topics  . Smoking  status: Never Smoker  . Smokeless tobacco: Never Used  . Alcohol use No     Allergies   Patient has no known allergies.   Review of Systems Review of Systems  Musculoskeletal: Positive for stiffness.  All other systems reviewed and are negative.    Physical Exam Updated Vital Signs BP 124/73   Pulse 80   Temp 98.2 F (36.8 C) (Oral)   Resp 16   SpO2 98%   Physical Exam  Constitutional: He appears well-developed and well-nourished.  HENT:  Head: Normocephalic.  Eyes: Pupils are equal, round, and reactive to light.  Cardiovascular: Normal rate.   Pulmonary/Chest: Effort normal.  Musculoskeletal: He exhibits tenderness.  20 cm bruised area left mid leg,  Pain with movement.  nv and ns intact  Neurological: He is alert.  Skin: Skin is warm.  Nursing note and vitals reviewed.    ED Treatments / Results  Labs (all labs ordered are listed, but only abnormal results are displayed) Labs Reviewed - No data to display  EKG  EKG Interpretation None       Radiology Dg Tibia/fibula Left  Result Date: 06/14/2017 CLINICAL DATA:  Injury last night EXAM: LEFT TIBIA AND FIBULA - 2 VIEW COMPARISON:  None. FINDINGS: No acute fracture. No dislocation. Unremarkable soft tissues. Postop changes from talocalcaneal fusion. IMPRESSION: No acute bony pathology. Electronically Signed   By: Jolaine Click M.D.   On: 06/14/2017 13:29   Dg Knee  Complete 4 Views Left  Result Date: 06/14/2017 CLINICAL DATA:  Injury last night EXAM: LEFT KNEE - COMPLETE 4+ VIEW COMPARISON:  None. FINDINGS: No acute fracture. No dislocation. Unremarkable soft tissues. Mild narrowing of the lateral compartment. IMPRESSION: No acute bony pathology. Electronically Signed   By: Jolaine ClickArthur  Hoss M.D.   On: 06/14/2017 13:30    Procedures Procedures (including critical care time)  Medications Ordered in ED Medications - No data to display   Initial Impression / Assessment and Plan / ED Course  I have reviewed  the triage vital signs and the nursing notes.  Pertinent labs & imaging results that were available during my care of the patient were reviewed by me and considered in my medical decision making (see chart for details).       Final Clinical Impressions(s) / ED Diagnoses   Final diagnoses:  Contusion of left lower extremity, initial encounter    New Prescriptions New Prescriptions   IBUPROFEN (ADVIL,MOTRIN) 600 MG TABLET    Take 1 tablet (600 mg total) by mouth every 6 (six) hours as needed.   An After Visit Summary was printed and given to the patient.   Elson AreasSofia, Leslie K, PA-C 06/14/17 1407    Doug SouJacubowitz, Sam, MD 06/14/17 [phone]

## 2017-06-14 NOTE — Discharge Instructions (Signed)
Return if any problems.

## 2017-06-14 NOTE — ED Notes (Signed)
Bruise noted to right lower leg. Pt reports he tripped over a fishing line last night, striking right lower leg on a log.

## 2018-10-24 IMAGING — CT CT HEAD W/O CM
4 series · 16 of 47 positions shown, 18 images · non-contrast
Comparison: 08/22/2011 CT brain, 03/23/2010 MR brain

CLINICAL DATA: Pt is coming from home with concerns of having a
seizure this morning at approximately 1511. Pt reports waking up in
his bed with right eye laceration and concern of a seizure.

EXAM:
CT HEAD WITHOUT CONTRAST
TECHNIQUE: Contiguous axial images were obtained from the base of the skull
through the vertex without intravenous contrast.

[Series 3: head without · axial · non-contrast · 0.55mm/px · z∈[-116,+24]mm · 7 of 38 slices shown, 9 images]
[im 5/38  brain]
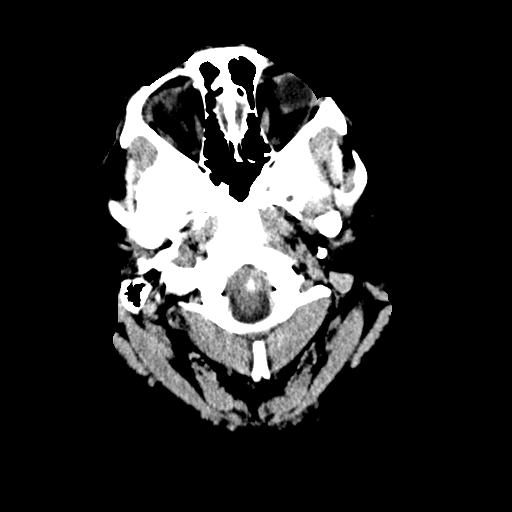
[im 5/38  bone]
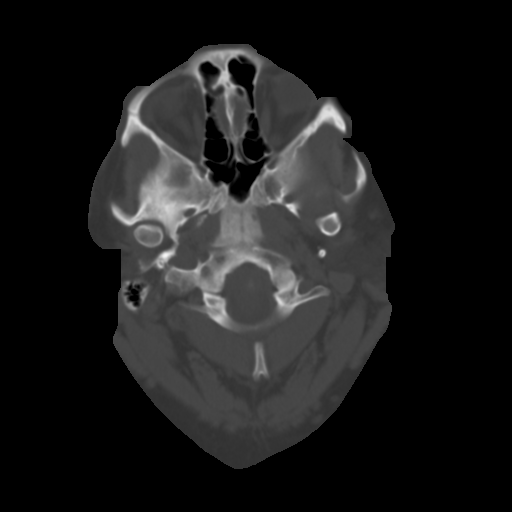
[im 10/38  brain]
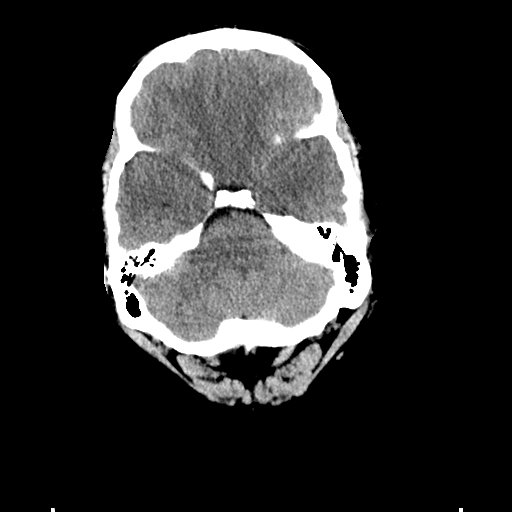
[im 14/38  brain]
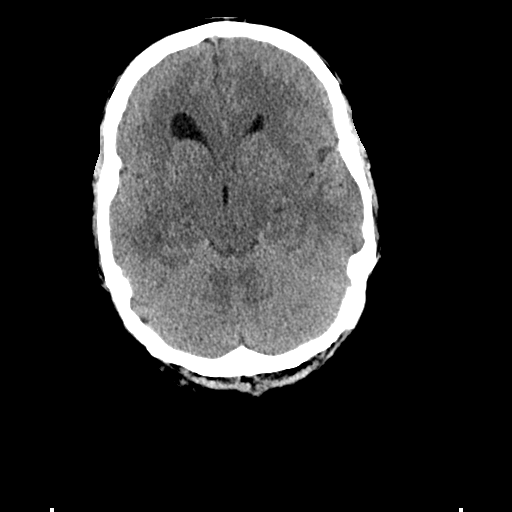
[im 19/38  brain]
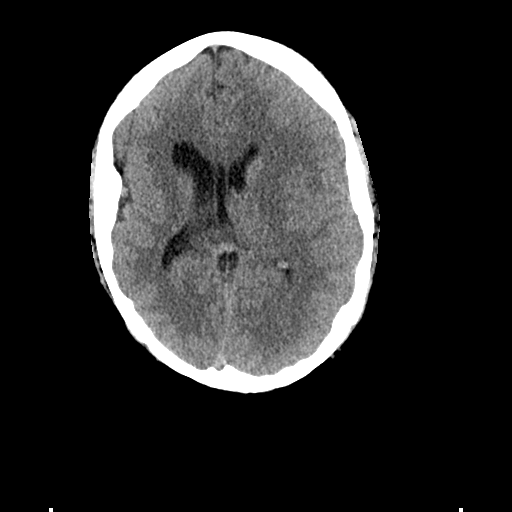
[im 24/38  brain]
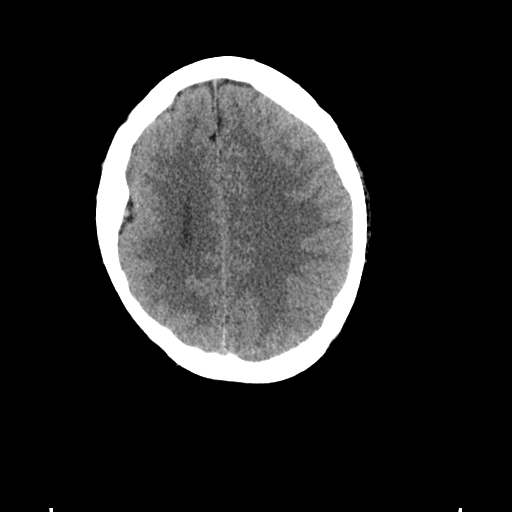
[im 24/38  bone]
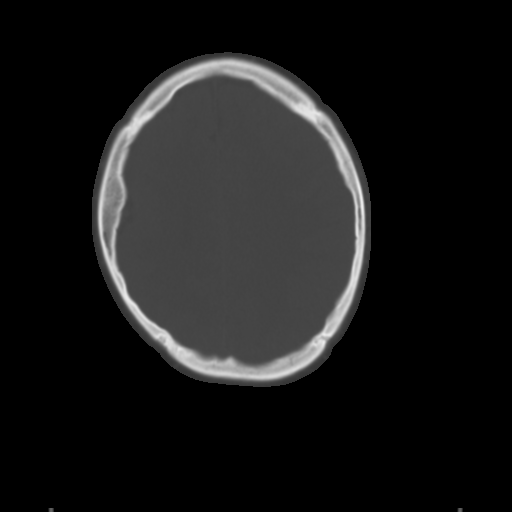
[im 28/38  brain]
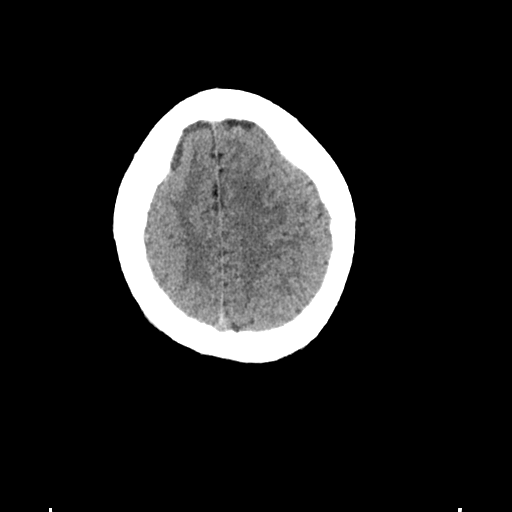
[im 33/38  brain]
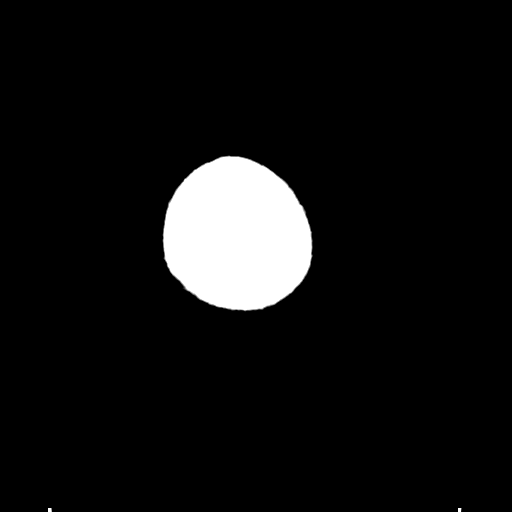

[Series 4: head bone · axial · 0.55mm/px · z∈[-118,-80]mm · 3 of 95 slices shown]
[im 10/95  bone]
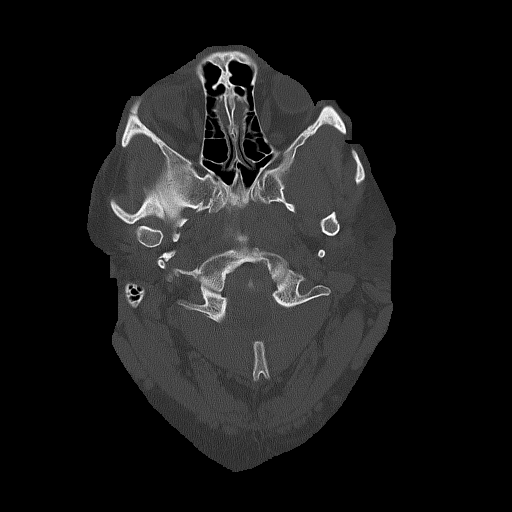
[im 19/95  bone]
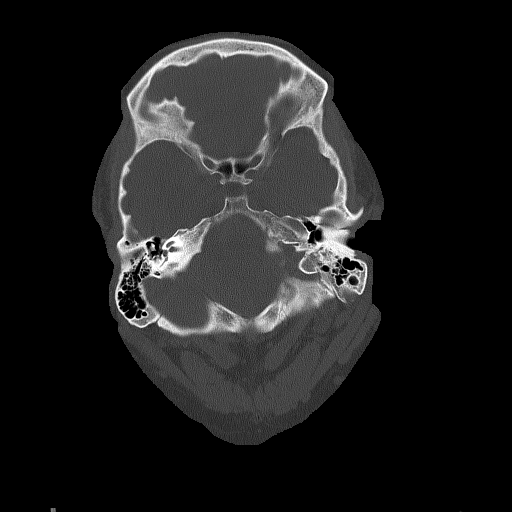
[im 29/95  bone]
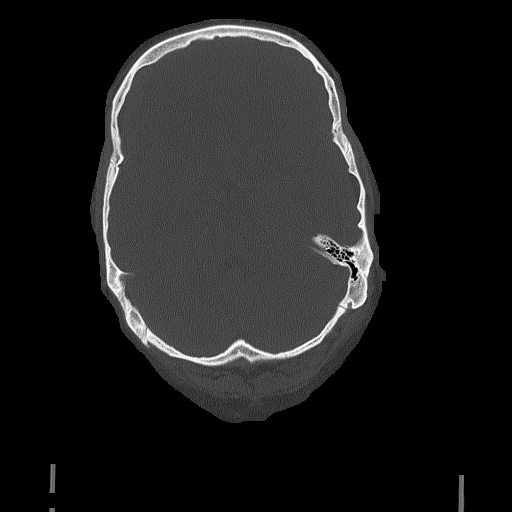

[Series 5: head without cor · coronal · non-contrast · 0.37mm/px · 3 of 76 slices shown]
[im 26/76  brain]
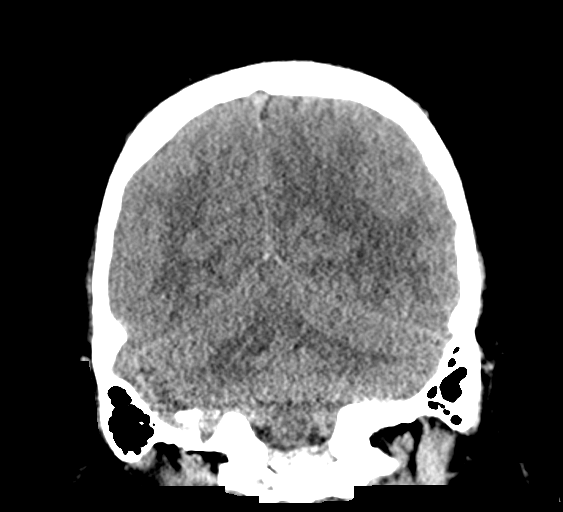
[im 34/76  brain]
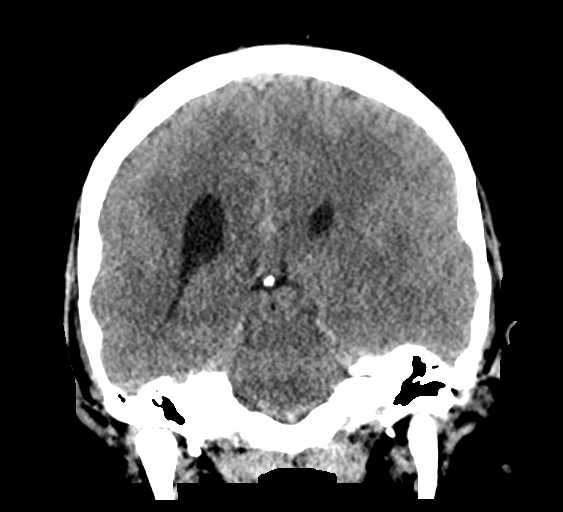
[im 42/76  brain]
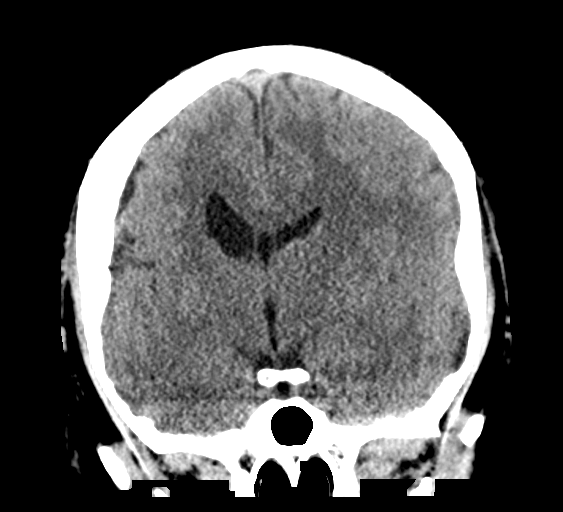

[Series 6: head without sag · sagittal · non-contrast · 0.37mm/px · 3 of 67 slices shown]
[im 23/67  brain]
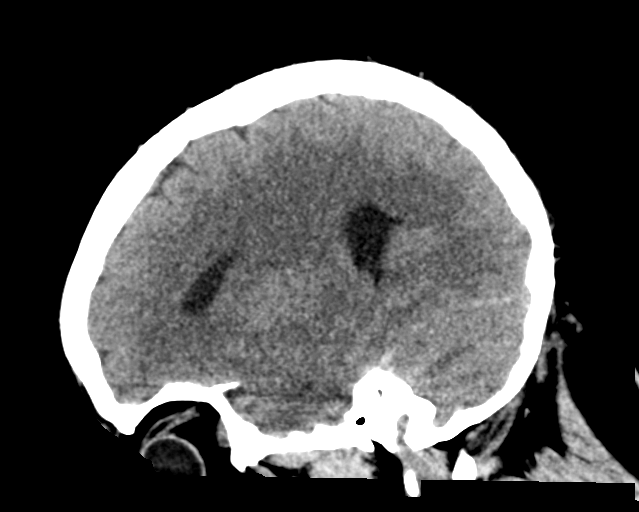
[im 34/67  brain]
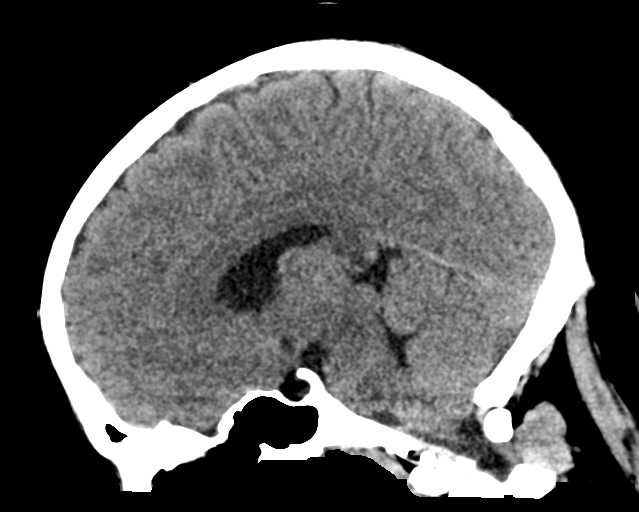
[im 45/67  brain]
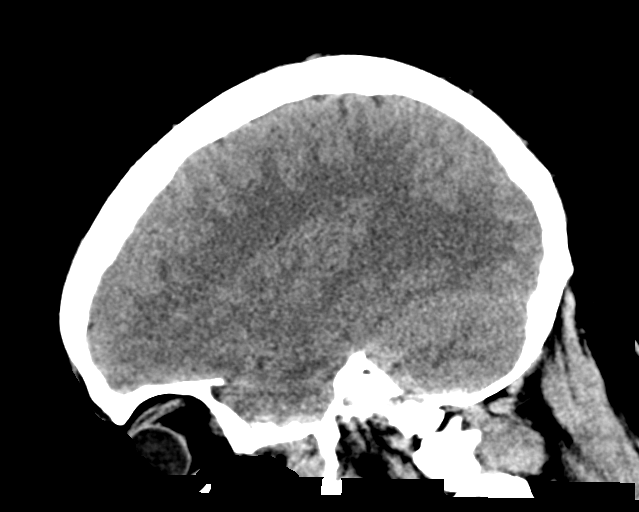

[16 of 47 positions shown; findings below may reference images not displayed]

FINDINGS: Brain: No evidence of acute infarction, hemorrhage, hydrocephalus,
extra-axial collection or mass lesion/mass effect. Unchanged
previous demonstrated unilateral right polymicrogyria. Chronic right
cerebral volume loss.

Vascular: No hyperdense vessel or unexpected calcification.

Skull: No osseous abnormality.

Sinuses/Orbits: Visualized paranasal sinuses are clear. Visualized
mastoid sinuses are clear. Visualized orbits demonstrate no focal
abnormality.

Other: None
IMPRESSION: 1. No acute intracranial pathology.

## 2020-07-03 ENCOUNTER — Telehealth: Payer: Self-pay | Admitting: Oncology

## 2020-07-03 ENCOUNTER — Other Ambulatory Visit: Payer: Self-pay | Admitting: Oncology

## 2020-07-03 DIAGNOSIS — U071 COVID-19: Secondary | ICD-10-CM

## 2020-07-03 NOTE — Telephone Encounter (Signed)
I connected by phone with  Micheal Jensen to discuss the potential use of an new treatment for mild to moderate COVID-19 viral infection in non-hospitalized patients.   This patient is a age/sex that meets the FDA criteria for Emergency Use Authorization of casirivimab\imdevimab.  Has a (+) direct SARS-CoV-2 viral test result 1. Has mild or moderate COVID-19  2. Is ? 28 years of age and weighs ? 40 kg 3. Is NOT hospitalized due to COVID-19 4. Is NOT requiring oxygen therapy or requiring an increase in baseline oxygen flow rate due to COVID-19 5. Is within 10 days of symptom onset 6. Has at least one of the high risk factor(s) for progression to severe COVID-19 and/or hospitalization as defined in EUA. ? Specific high risk criteria :Obesity    Symptom onset  06/27/20   I have spoken and communicated the following to the patient or parent/caregiver:   1. FDA has authorized the emergency use of casirivimab\imdevimab for the treatment of mild to moderate COVID-19 in adults and pediatric patients with positive results of direct SARS-CoV-2 viral testing who are 18 years of age and older weighing at least 40 kg, and who are at high risk for progressing to severe COVID-19 and/or hospitalization.   2. The significant known and potential risks and benefits of casirivimab\imdevimab, and the extent to which such potential risks and benefits are unknown.   3. Information on available alternative treatments and the risks and benefits of those alternatives, including clinical trials.   4. Patients treated with casirivimab\imdevimab should continue to self-isolate and use infection control measures (e.g., wear mask, isolate, social distance, avoid sharing personal items, clean and disinfect "high touch" surfaces, and frequent handwashing) according to CDC guidelines.    5. The patient or parent/caregiver has the option to accept or refuse casirivimab\imdevimab .   After reviewing this information with the  patient, The patient agreed to proceed with receiving casirivimab\imdevimab infusion and will be provided a copy of the Fact sheet prior to receiving the infusion.Micheal Jensen, AGNP-C [phone] (Infusion Center Hotline)

## 2020-07-04 ENCOUNTER — Ambulatory Visit (HOSPITAL_COMMUNITY)
Admission: RE | Admit: 2020-07-04 | Discharge: 2020-07-04 | Disposition: A | Discharge status: Home / Self Care | Payer: Medicare Other | Source: Ambulatory Visit | Attending: Pulmonary Disease | Admitting: Pulmonary Disease

## 2020-07-04 DIAGNOSIS — U071 COVID-19: Secondary | ICD-10-CM | POA: Diagnosis present

## 2020-07-04 DIAGNOSIS — Z23 Encounter for immunization: Secondary | ICD-10-CM | POA: Insufficient documentation

## 2020-07-04 MED ORDER — SODIUM CHLORIDE 0.9 % IV SOLN: INTRAVENOUS | Status: DC | PRN

## 2020-07-04 MED ORDER — ALBUTEROL SULFATE HFA 108 (90 BASE) MCG/ACT IN AERS: 2.0000 | INHALATION_SPRAY | Freq: Once | RESPIRATORY_TRACT | Status: DC | PRN

## 2020-07-04 MED ORDER — DIPHENHYDRAMINE HCL 50 MG/ML IJ SOLN: 50.0000 mg | Freq: Once | INTRAMUSCULAR | Status: DC | PRN

## 2020-07-04 MED ORDER — METHYLPREDNISOLONE SODIUM SUCC 125 MG IJ SOLR: 125.0000 mg | Freq: Once | INTRAMUSCULAR | Status: DC | PRN

## 2020-07-04 MED ORDER — SODIUM CHLORIDE 0.9 % IV SOLN
1200.0000 mg | Freq: Once | INTRAVENOUS | Status: AC
  Administered 2020-07-04: 1200 mg via INTRAVENOUS

## 2020-07-04 MED ORDER — EPINEPHRINE 0.3 MG/0.3ML IJ SOAJ: 0.3000 mg | Freq: Once | INTRAMUSCULAR | Status: DC | PRN

## 2020-07-04 MED ORDER — FAMOTIDINE IN NACL 20-0.9 MG/50ML-% IV SOLN: 20.0000 mg | Freq: Once | INTRAVENOUS | Status: DC | PRN

## 2020-07-04 NOTE — Progress Notes (Signed)
  Diagnosis: COVID-19  Physician: Dr Patrick Wright  Procedure: [facility] Med: casirivimab\imdevimab infusion - Provided patient with casirivimab\imdevimab fact sheet for patients, parents and caregivers prior to infusion.  Complications: No immediate complications noted.  Discharge: Discharged home   Onnie Alatorre L 07/04/2020   

## 2020-07-04 NOTE — Discharge Instructions (Signed)

## 2024-10-09 ENCOUNTER — Ambulatory Visit (HOSPITAL_BASED_OUTPATIENT_CLINIC_OR_DEPARTMENT_OTHER): Admission: EM | Admit: 2024-10-09 | Discharge: 2024-10-09 | Disposition: A | Discharge status: Home / Self Care

## 2024-10-09 ENCOUNTER — Encounter (HOSPITAL_BASED_OUTPATIENT_CLINIC_OR_DEPARTMENT_OTHER): Payer: Self-pay

## 2024-10-09 DIAGNOSIS — K047 Periapical abscess without sinus: Secondary | ICD-10-CM | POA: Diagnosis not present

## 2024-10-09 MED ORDER — DICLOFENAC SODIUM 50 MG PO TBEC: 50.0000 mg | DELAYED_RELEASE_TABLET | Freq: Two times a day (BID) | ORAL | 1 refills | Status: AC

## 2024-10-09 MED ORDER — AMOXICILLIN-POT CLAVULANATE 875-125 MG PO TABS: 1.0000 | ORAL_TABLET | Freq: Two times a day (BID) | ORAL | 0 refills | Status: AC

## 2024-10-09 MED ORDER — KETOROLAC TROMETHAMINE 60 MG/2ML IM SOLN
60.0000 mg | Freq: Once | INTRAMUSCULAR | Status: AC
  Administered 2024-10-09: 60 mg via INTRAMUSCULAR

## 2024-10-09 NOTE — Discharge Instructions (Addendum)
  1. Dental infection (Primary) - ketorolac  (TORADOL ) injection 60 mg given in UC for acute pain secondary to dental infection - diclofenac  (VOLTAREN ) 50 MG EC tablet; Take 1 tablet (50 mg total) by mouth 2 (two) times daily.  Dispense: 30 tablet; Refill: 1 - amoxicillin -clavulanate (AUGMENTIN ) 875-125 MG tablet; Take 1 tablet by mouth 2 (two) times daily for 10 days.  Dispense: 20 tablet; Refill: 0  -Continue to monitor symptoms for any change in severity if there is any escalation of current symptoms or development of new symptoms follow-up in ER for further evaluation and management.

## 2024-10-09 NOTE — ED Triage Notes (Signed)
 Pt states he has been having left upper molar and lower molar pain for the last 2 days. The pain got worst last night. Pt reports the tooth had previously had a filling on the upper molar. Pt denies lumps or cracks in the teeth. He has reached out to his dentist and is working on getting worked into his schedule. Pt has hx of seizures and states lots of pain/ stress increase his chances of having a seizure.

## 2024-10-09 NOTE — ED Provider Notes (Signed)
 [facility]  Note:  This document was prepared using Conservation officer, historic buildings and may include unintentional dictation errors.  MRN: [id number] DOB: 1991/06/14  Subjective:   Abdullah Rizzi is a 33 y.o. male presenting for evaluation of left upper and lower tooth pain x 2 days.  Patient reports that pain became much worse last night patient denies taking any over-the-counter medication to treat symptoms prior to arrival in urgent care.  Patient reached out to his dentist who advised him to come to urgent care for evaluation and pain management until follow-up in the clinic.  Patient has past history of seizures and states that pain and stress increase incidence of seizure.  Patient denies any known dental injury or known infection.  Patient did recently have fillings placed in tooth on the left upper and was concerned that filling may be contributing to pain.  No current facility-administered medications for this encounter.  Current Outpatient Medications:    amoxicillin -clavulanate (AUGMENTIN ) 875-125 MG tablet, Take 1 tablet by mouth 2 (two) times daily for 10 days., Disp: 20 tablet, Rfl: 0   cenobamate (XCOPRI) 200 MG TABS, Take 1 tablet by mouth daily., Disp: , Rfl:    diclofenac  (VOLTAREN ) 50 MG EC tablet, Take 1 tablet (50 mg total) by mouth 2 (two) times daily., Disp: 30 tablet, Rfl: 1   Eslicarbazepine Acetate 400 MG TABS, Take 1,200 mg by mouth., Disp: , Rfl:    pramipexole (MIRAPEX) 0.125 MG tablet, Take 0.25 mg by mouth., Disp: , Rfl:    rizatriptan (MAXALT) 10 MG tablet, Take 1 pill at onset of HA. May repeat in 2 hours if needed but no more than 2 in 24 hours., Disp: , Rfl:    levETIRAcetam  (KEPPRA ) 500 MG tablet, Take 3 tablets (1,500 mg total) by mouth 2 (two) times daily., Disp: 180 tablet, Rfl: 0   No Known Allergies  Past Medical History:  Diagnosis Date   CP (cerebral palsy) (HCC)    Hydrocephalus (HCC)    Kidney stone    Seizures (HCC)    as a baby      Past Surgical History:  Procedure Laterality Date   ANKLE ARTHROSCOPY Right     History reviewed. No pertinent family history.  Social History   Tobacco Use   Smoking status: Never   Smokeless tobacco: Never  Vaping Use   Vaping status: Never Used  Substance Use Topics   Alcohol use: No   Drug use: No    ROS Refer to HPI for ROS details.  Objective:    Vitals: BP 125/83 (BP Location: Right Arm)   Pulse 71   Temp 98.3 F (36.8 C) (Oral)   Resp 20   SpO2 95%   Physical Exam Vitals and nursing note reviewed.  Constitutional:      General: He is not in acute distress.    Appearance: Normal appearance. He is well-developed. He is not ill-appearing or toxic-appearing.  HENT:     Head: Normocephalic.     Mouth/Throat:     Lips: Pink.     Mouth: Mucous membranes are moist.     Dentition: Abnormal dentition. Dental tenderness present. No gingival swelling, dental caries or dental abscesses.     Pharynx: Oropharynx is clear. No posterior oropharyngeal erythema.  Cardiovascular:     Rate and Rhythm: Normal rate.  Pulmonary:     Effort: Pulmonary effort is normal. No respiratory distress.  Skin:    General: Skin is warm and dry.  Neurological:  General: No focal deficit present.     Mental Status: He is alert and oriented to person, place, and time.  Psychiatric:        Mood and Affect: Mood normal.        Behavior: Behavior normal.     Procedures  No results found for this or any previous visit (from the past 24 hours).  Assessment and Plan :     Discharge Instructions       1. Dental infection (Primary) - ketorolac  (TORADOL ) injection 60 mg given in UC for acute pain secondary to dental infection - diclofenac  (VOLTAREN ) 50 MG EC tablet; Take 1 tablet (50 mg total) by mouth 2 (two) times daily.  Dispense: 30 tablet; Refill: 1 - amoxicillin -clavulanate (AUGMENTIN ) 875-125 MG tablet; Take 1 tablet by mouth 2 (two) times daily for 10 days.   Dispense: 20 tablet; Refill: 0  -Continue to monitor symptoms for any change in severity if there is any escalation of current symptoms or development of new symptoms follow-up in ER for further evaluation and management.      Ever Halberg B Venetta Knee   Sahmir Weatherbee, [place] B, [place] 10/09/24 1218
